# Patient Record
Sex: Male | Born: 1940 | Race: White | Hispanic: No | Marital: Married | Smoking: Former smoker
Health system: Southern US, Community
[De-identification: ages and names within clinical notes are randomized; demographics above are authoritative.]

## PROBLEM LIST (undated history)

## (undated) DIAGNOSIS — I10 Essential (primary) hypertension: Secondary | ICD-10-CM

## (undated) DIAGNOSIS — M1990 Unspecified osteoarthritis, unspecified site: Secondary | ICD-10-CM

## (undated) DIAGNOSIS — C61 Malignant neoplasm of prostate: Secondary | ICD-10-CM

## (undated) DIAGNOSIS — E785 Hyperlipidemia, unspecified: Secondary | ICD-10-CM

## (undated) DIAGNOSIS — K589 Irritable bowel syndrome without diarrhea: Secondary | ICD-10-CM

## (undated) DIAGNOSIS — E119 Type 2 diabetes mellitus without complications: Secondary | ICD-10-CM

## (undated) DIAGNOSIS — I2510 Atherosclerotic heart disease of native coronary artery without angina pectoris: Secondary | ICD-10-CM

## (undated) DIAGNOSIS — K5730 Diverticulosis of large intestine without perforation or abscess without bleeding: Secondary | ICD-10-CM

## (undated) DIAGNOSIS — K219 Gastro-esophageal reflux disease without esophagitis: Secondary | ICD-10-CM

## (undated) DIAGNOSIS — H9190 Unspecified hearing loss, unspecified ear: Secondary | ICD-10-CM

## (undated) HISTORY — DX: Unspecified osteoarthritis, unspecified site: M19.90

## (undated) HISTORY — DX: Gastro-esophageal reflux disease without esophagitis: K21.9

## (undated) HISTORY — DX: Unspecified hearing loss, unspecified ear: H91.90

## (undated) HISTORY — DX: Irritable bowel syndrome without diarrhea: K58.9

## (undated) HISTORY — DX: Malignant neoplasm of prostate: C61

## (undated) HISTORY — DX: Hyperlipidemia, unspecified: E78.5

## (undated) HISTORY — DX: Atherosclerotic heart disease of native coronary artery without angina pectoris: I25.10

## (undated) HISTORY — DX: Diverticulosis of large intestine without perforation or abscess without bleeding: K57.30

## (undated) HISTORY — DX: Essential (primary) hypertension: I10

## (undated) HISTORY — DX: Type 2 diabetes mellitus without complications: E11.9

---

## 1996-01-26 HISTORY — PX: CHOLECYSTECTOMY: SHX55

## 2002-01-25 HISTORY — PX: HEMORRHOID SURGERY: SHX153

## 2004-03-09 ENCOUNTER — Ambulatory Visit (HOSPITAL_COMMUNITY): Admission: RE | Admit: 2004-03-09 | Discharge: 2004-03-09 | Payer: Self-pay | Admitting: Internal Medicine

## 2004-03-09 ENCOUNTER — Ambulatory Visit: Payer: Self-pay | Admitting: Internal Medicine

## 2005-04-07 ENCOUNTER — Ambulatory Visit: Payer: Self-pay | Admitting: Cardiology

## 2005-04-14 ENCOUNTER — Inpatient Hospital Stay (HOSPITAL_BASED_OUTPATIENT_CLINIC_OR_DEPARTMENT_OTHER): Admission: RE | Admit: 2005-04-14 | Discharge: 2005-04-14 | Payer: Self-pay | Admitting: Internal Medicine

## 2005-04-14 ENCOUNTER — Ambulatory Visit: Payer: Self-pay | Admitting: Internal Medicine

## 2005-05-11 ENCOUNTER — Ambulatory Visit: Payer: Self-pay | Admitting: Cardiology

## 2007-01-26 HISTORY — PX: PROSTATECTOMY: SHX69

## 2007-10-24 HISTORY — PX: TOTAL KNEE ARTHROPLASTY: SHX125

## 2014-10-09 ENCOUNTER — Encounter: Payer: Self-pay | Admitting: *Deleted

## 2014-10-09 ENCOUNTER — Other Ambulatory Visit: Payer: Self-pay | Admitting: *Deleted

## 2014-10-15 ENCOUNTER — Ambulatory Visit (INDEPENDENT_AMBULATORY_CARE_PROVIDER_SITE_OTHER): Payer: Medicare Other | Admitting: Cardiology

## 2014-10-15 ENCOUNTER — Encounter: Payer: Self-pay | Admitting: Cardiology

## 2014-10-15 ENCOUNTER — Encounter: Payer: Self-pay | Admitting: *Deleted

## 2014-10-15 VITALS — BP 125/75 | HR 54 | Ht 71.0 in | Wt 241.8 lb

## 2014-10-15 DIAGNOSIS — R0789 Other chest pain: Secondary | ICD-10-CM

## 2014-10-15 DIAGNOSIS — I10 Essential (primary) hypertension: Secondary | ICD-10-CM | POA: Diagnosis not present

## 2014-10-15 DIAGNOSIS — R0602 Shortness of breath: Secondary | ICD-10-CM

## 2014-10-15 DIAGNOSIS — E119 Type 2 diabetes mellitus without complications: Secondary | ICD-10-CM

## 2014-10-15 DIAGNOSIS — Z136 Encounter for screening for cardiovascular disorders: Secondary | ICD-10-CM | POA: Diagnosis not present

## 2014-10-15 DIAGNOSIS — E782 Mixed hyperlipidemia: Secondary | ICD-10-CM

## 2014-10-15 NOTE — Progress Notes (Signed)
Cardiology Office Note  Date: 10/15/2014   ID: Nathan Wiley, DOB [birth year], MRN [id number]  PCP: Olena Mater, MD  Consulting Cardiologist: Rozann Lesches, MD   Chief Complaint  Patient presents with  . History of CAD  . Dyspnea on exertion    History of Present Illness: Nathan Wiley is a 74 y.o. male referred for cardiology consultation by Dr. [name]. He was seen years ago by Dr. Dannielle Burn in our practice, I do not have complete records regarding his history of coronary atherosclerosis. He recalls undergoing a cardiac catheterization at [facility] about 10 years ago, and had what sounds like nonobstructive disease that did not require any specific revascularization.  He presents to the office now describing progressive dyspnea on exertion and fatigue over the last several months, particularly during the summer. He also has been experiencing chest "tightness" with exertion including radiation up into the neck, very consistent with angina. 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  He also states that he is in the process of pursuing elective right knee replacement with an orthopedists in [place]. He already had his left knee replaced about 5 years ago.  ECG today shows sinus rhythm with low voltage and nonspecific ST-T wave abnormalities. Cannot exclude old inferior infarct pattern.  He has not had any interval ischemic evaluation in the last 10 years.   Past Medical History  Diagnosis Date  . Arthritis   . CAD (coronary artery disease)     Nonobstructive at cardiac catheterization approximately 10 years ago  . Type 2 diabetes mellitus   . Dyslipidemia   . GERD (gastroesophageal reflux disease)   . Essential hypertension   . Prostate cancer   . Hearing loss   . Diverticulosis of colon   . Irritable bowel syndrome     Past  Surgical History  Procedure Laterality Date  . Hemorrhoid surgery  2004  . Cholecystectomy  1998  . Total knee arthroplasty Left 10/24/07  . Prostatectomy  2009    Current Outpatient Prescriptions  Medication Sig Dispense Refill  . aspirin EC 81 MG tablet Take 3 tablets by mouth daily.    Marland Kitchen estazolam (PROSOM) 1 MG tablet Take 1 tablet by mouth at bedtime.  0  . metoprolol (LOPRESSOR) 50 MG tablet Take 0.5 tablets by mouth daily.    . metroNIDAZOLE (METROGEL) 1 % gel Apply 1 application topically daily.    Marland Kitchen omeprazole (PRILOSEC) 40 MG capsule Take 1 capsule by mouth daily.    Marland Kitchen tretinoin (RETIN-A) 0.01 % gel Apply 1 application topically at bedtime.     No current facility-administered medications for this visit.    Allergies:  Review of patient's allergies indicates no known allergies.   Social History: The patient  reports that he quit smoking about 33 years ago. His smoking use included Cigarettes. He started smoking about 53 years ago. He has a 20 pack-year smoking history. He has never used smokeless tobacco. He reports that he does not drink alcohol or use illicit drugs.   Family History: The patient's family history includes Arthritis in his sister; Colon cancer in his father and maternal grandfather; Heart failure in his father; Prostate cancer in his father.   ROS:  Please see the history of present illness. Otherwise, complete review of systems is positive for chronic right knee pain.  All  other systems are reviewed and negative.   Physical Exam: VS:  BP 125/75 mmHg  Pulse 54  Ht 5\' 11"  (1.803 m)  Wt 241 lb 12.8 oz (109.68 kg)  BMI 33.74 kg/m2  SpO2 99%, BMI Body mass index is 33.74 kg/(m^2).  Wt Readings from Last 3 Encounters:  10/15/14 241 lb 12.8 oz (109.68 kg)     General: Patient appears comfortable at rest. HEENT: Conjunctiva and lids normal, oropharynx clear. Neck: Supple, no elevated JVP or carotid bruits, no thyromegaly. Lungs: Clear to auscultation,  nonlabored breathing at rest. Cardiac: Regular rate and rhythm, no S3 or significant systolic murmur, no pericardial rub. Abdomen: Soft, nontender, bowel sounds present. Extremities: No pitting edema, distal pulses 2+.i Skin: Warm and dry. Musculoskeletal: No kyphosis. Neuropsychiatric: Alert and oriented x3, affect grossly appropriate.   ECG: ECG is ordered today.   Recent Labwork:  09/23/2014: Cholesterol 197, triglycerides 240, HDL 29, LDL 120, AST 18, ALT 16, potassium 4.8, BUN 15, creatinine 1.0, hemoglobin 15.6, platelets 227  Assessment and Plan:  1. Dyspnea on exertion and chest tightness consistent with angina. Patient has a history of nonobstructive CAD by cardiac catheterization approximately 10 years ago. ECG is abnormal but largely nonspecific as outlined above. He has concurrent type 2 diabetes mellitus, hyperlipidemia and hypertension. Plan is to proceed with a Lexiscan Cardiolite for further evaluation. I have asked that he put his right knee replacement surgery on hold for now pending further cardiac evaluation.  2. Essential hypertension, blood pressure normal today. He is on Lopressor.  3. Hyperlipidemia by history, recent LDL 120. Not on any specific medical therapy at this time.  4. Type 2 diabetes mellitus, followed by Dr. [name].  Current medicines were reviewed with the patient today.   Orders Placed This Encounter  Procedures  . NM Myocar Multi W/Spect W/Wall Motion / EF  . Myocardial Perfusion Imaging  . EKG 12-Lead    Disposition: Call with test results.   Signed, Satira Sark, MD, [facility] 10/15/2014 3:29 PM    [facility] [address], [place], [place] [address] Phone: [phone]; Fax: [phone]

## 2014-10-15 NOTE — Patient Instructions (Signed)
Your physician recommends that you continue on your current medications as directed. Please refer to the Current Medication list given to you today. Your physician has requested that you have a lexiscan myoview. For further information please visit [email or web address]. Please follow instruction sheet, as given. We will call you with your results. 

## 2014-10-21 ENCOUNTER — Encounter (HOSPITAL_COMMUNITY)
Admission: RE | Admit: 2014-10-21 | Discharge: 2014-10-21 | Disposition: A | Discharge status: Home / Self Care | Payer: Medicare Other | Source: Ambulatory Visit | Attending: Cardiology | Admitting: Cardiology

## 2014-10-21 ENCOUNTER — Inpatient Hospital Stay (HOSPITAL_COMMUNITY): Admission: RE | Admit: 2014-10-21 | Payer: Private Health Insurance - Indemnity | Source: Ambulatory Visit

## 2014-10-21 ENCOUNTER — Encounter (HOSPITAL_COMMUNITY): Payer: Self-pay

## 2014-10-21 DIAGNOSIS — R0602 Shortness of breath: Secondary | ICD-10-CM | POA: Diagnosis not present

## 2014-10-21 DIAGNOSIS — R0789 Other chest pain: Secondary | ICD-10-CM

## 2014-10-21 LAB — NM MYOCAR MULTI W/SPECT W/WALL MOTION / EF
CHL CUP NUCLEAR SDS: 3
CHL CUP NUCLEAR SRS: 3
CHL CUP RESTING HR STRESS: 52 {beats}/min
LV dias vol: 84 mL
LV sys vol: 37 mL
NUC STRESS TID: 1.21
Peak HR: 76 {beats}/min
RATE: 0.35
SSS: 6

## 2014-10-21 MED ORDER — TECHNETIUM TC 99M SESTAMIBI GENERIC - CARDIOLITE
10.0000 | Freq: Once | INTRAVENOUS | Status: AC | PRN
  Administered 2014-10-21: 11 via INTRAVENOUS

## 2014-10-21 MED ORDER — REGADENOSON 0.4 MG/5ML IV SOLN
INTRAVENOUS | Status: AC
  Administered 2014-10-21: 0.4 mg via INTRAVENOUS
  Filled 2014-10-21: qty 5

## 2014-10-21 MED ORDER — TECHNETIUM TC 99M SESTAMIBI - CARDIOLITE
30.0000 | Freq: Once | INTRAVENOUS | Status: AC | PRN
  Administered 2014-10-21: 10:00:00 32 via INTRAVENOUS

## 2014-10-21 MED ORDER — SODIUM CHLORIDE 0.9 % IJ SOLN
INTRAMUSCULAR | Status: AC
  Administered 2014-10-21: 10 mL via INTRAVENOUS
  Filled 2014-10-21: qty 3

## 2014-10-22 ENCOUNTER — Telehealth: Payer: Self-pay | Admitting: *Deleted

## 2014-10-22 NOTE — Telephone Encounter (Signed)
Patient informed. 

## 2014-10-22 NOTE — Telephone Encounter (Signed)
-----   Message from Satira Sark, MD sent at 10/22/2014 12:27 PM EDT ----- Reviewed report and images. No focal ischemia with normal LVEF, overall low risk examination. This would argue against substantial progression in previously documented nonobstructive CAD, although his recent symptoms were consistent with angina. We will continue medical therapy and arrange office follow-up (3-4 months). Can always discuss further evaluation with cardiac catheterization if his symptoms progress further. Based on this stress test evaluation, he should be able to proceed with planned knee surgery as discussed in the recent consultation note.

## 2014-10-24 ENCOUNTER — Telehealth: Payer: Self-pay | Admitting: *Deleted

## 2014-10-24 MED ORDER — ISOSORBIDE MONONITRATE ER 30 MG PO TB24: 30.0000 mg | ORAL_TABLET | Freq: Every day | ORAL | Status: AC

## 2014-10-24 NOTE — Telephone Encounter (Signed)
I am concerned about his recurrent symptoms, although recent stress test was low risk and did not demonstrate any active ischemia. We could try putting him on Imdur 30 mg daily to see if this is helpful. Frankly, if he continues to have symptoms on medical therapy, he may need to have a repeat cardiac catheterization.

## 2014-10-24 NOTE — Telephone Encounter (Signed)
Patient informed and also discussed the possibility of having a headache with this medications. Patient advised that if a headache occurs, try taking tylenol for his headache and that it should go away after a few days. Patient informed that if the headache did not go away, he should try half the tablet daily and if the headache continued that he would need to contact our office so that he can stop the medication.

## 2014-10-24 NOTE — Telephone Encounter (Signed)
Patient c/o having chest pain last night that radiated to throat and both sides of jaw last night that prevented him from swallowing and lasted 10-15 seconds. Patient rated chest pain 8/10. Patient said he had to take short breaths because he couldn't swallow. Patient is unsure if the pain made him feel dizzy. Patient said that the pain was worse than the times he experienced it before. Patient does not have nitroglycerin at home. Please advise.

## 2015-01-22 ENCOUNTER — Ambulatory Visit: Payer: Private Health Insurance - Indemnity | Admitting: Cardiology

## 2015-03-04 DIAGNOSIS — C4492 Squamous cell carcinoma of skin, unspecified: Secondary | ICD-10-CM

## 2015-03-04 HISTORY — DX: Squamous cell carcinoma of skin, unspecified: C44.92

## 2017-06-10 ENCOUNTER — Institutional Professional Consult (permissible substitution): Payer: Private Health Insurance - Indemnity | Admitting: Internal Medicine

## 2019-05-15 ENCOUNTER — Other Ambulatory Visit: Payer: Self-pay

## 2019-05-15 ENCOUNTER — Encounter: Payer: Self-pay | Admitting: Physician Assistant

## 2019-05-15 ENCOUNTER — Ambulatory Visit: Payer: Self-pay | Admitting: Physician Assistant

## 2019-05-15 ENCOUNTER — Ambulatory Visit (INDEPENDENT_AMBULATORY_CARE_PROVIDER_SITE_OTHER): Payer: Medicare HMO | Admitting: Physician Assistant

## 2019-05-15 ENCOUNTER — Encounter: Payer: Self-pay | Admitting: *Deleted

## 2019-05-15 DIAGNOSIS — D492 Neoplasm of unspecified behavior of bone, soft tissue, and skin: Secondary | ICD-10-CM

## 2019-05-15 DIAGNOSIS — D485 Neoplasm of uncertain behavior of skin: Secondary | ICD-10-CM | POA: Diagnosis not present

## 2019-05-15 DIAGNOSIS — D489 Neoplasm of uncertain behavior, unspecified: Secondary | ICD-10-CM

## 2019-05-15 NOTE — Patient Instructions (Signed)

## 2019-05-16 ENCOUNTER — Encounter: Payer: Self-pay | Admitting: Physician Assistant

## 2019-05-16 NOTE — Progress Notes (Addendum)
   Follow-Up Visit   Subjective  Nathan Wiley is a 79 y.o. male who presents for the following: Skin Problem (mid chest for years cyst? patient stated it was hard). No drainage, growing, persistent, no bleeding. I bothers him and get irritated from his shirt collar.    The following portions of the chart were reviewed this encounter and updated as appropriate: Tobacco  Allergies  Meds  Problems  Med Hx  Surg Hx  Fam Hx      Objective  Well appearing patient in no apparent distress; mood and affect are within normal limits.  All skin waist up examined.  Objective  Chest - Medial [facility]): White papule     Assessment & Plan  Neoplasm of uncertain behavior Chest - Medial (Center)  Skin / nail biopsy Type of biopsy: punch   Informed consent: discussed and consent obtained   Timeout: patient name, date of birth, surgical site, and procedure verified   Procedure prep:  Patient was prepped and draped in usual sterile fashion (nonsterile) Prep type:  Chlorhexidine Anesthesia: the lesion was anesthetized in a standard fashion   Anesthetic:  1% lidocaine w/ epinephrine 1-100,000 local infiltration Punch size:  8 mm Suture type: nylon   Suture removal (days):  14 Hemostasis achieved with: suture   Outcome: patient tolerated procedure well   Post-procedure details: wound care instructions given   Post-procedure details comment:  Nonsterile  Specimen 1 - Surgical pathology Differential Diagnosis: milia Check Margins: No

## 2019-05-28 ENCOUNTER — Other Ambulatory Visit: Payer: Self-pay

## 2019-05-28 ENCOUNTER — Ambulatory Visit (INDEPENDENT_AMBULATORY_CARE_PROVIDER_SITE_OTHER): Payer: Medicare HMO | Admitting: *Deleted

## 2019-05-28 DIAGNOSIS — Z4802 Encounter for removal of sutures: Secondary | ICD-10-CM

## 2019-05-28 NOTE — Progress Notes (Signed)
Patient here for suture removal. Removed two sutures from chest, Looks good no signs of infection. Pathology report to patient.

## 2019-05-28 NOTE — Patient Instructions (Signed)
After Suture Removal ° °1. After sutures are removed, the wound should be coated with an antibiotic ointment (eg. Polysporin, Bacitracin) and, if possible, kept covered with a Band-Aid or bandage for an additional 24 hours.  After that, no additional wound care is generally needed.  °2. It is alright to get the area wet. °3. If a skin cancer was removed, your skin should be re-examined in approximately three months. ° ° ° °

## 2020-03-25 ENCOUNTER — Emergency Department (HOSPITAL_COMMUNITY)
Admission: EM | Admit: 2020-03-25 | Discharge: 2020-03-26 | Disposition: A | Discharge status: Home / Self Care | Payer: Medicare HMO | Attending: Emergency Medicine | Admitting: Emergency Medicine

## 2020-03-25 ENCOUNTER — Other Ambulatory Visit: Payer: Self-pay

## 2020-03-25 ENCOUNTER — Encounter (HOSPITAL_COMMUNITY): Payer: Self-pay | Admitting: Emergency Medicine

## 2020-03-25 DIAGNOSIS — Z8546 Personal history of malignant neoplasm of prostate: Secondary | ICD-10-CM | POA: Insufficient documentation

## 2020-03-25 DIAGNOSIS — I10 Essential (primary) hypertension: Secondary | ICD-10-CM | POA: Insufficient documentation

## 2020-03-25 DIAGNOSIS — I2510 Atherosclerotic heart disease of native coronary artery without angina pectoris: Secondary | ICD-10-CM | POA: Diagnosis not present

## 2020-03-25 DIAGNOSIS — M7989 Other specified soft tissue disorders: Secondary | ICD-10-CM | POA: Insufficient documentation

## 2020-03-25 DIAGNOSIS — R911 Solitary pulmonary nodule: Secondary | ICD-10-CM

## 2020-03-25 DIAGNOSIS — Z85828 Personal history of other malignant neoplasm of skin: Secondary | ICD-10-CM | POA: Insufficient documentation

## 2020-03-25 DIAGNOSIS — Z96642 Presence of left artificial hip joint: Secondary | ICD-10-CM | POA: Diagnosis not present

## 2020-03-25 DIAGNOSIS — Z79899 Other long term (current) drug therapy: Secondary | ICD-10-CM | POA: Insufficient documentation

## 2020-03-25 DIAGNOSIS — R799 Abnormal finding of blood chemistry, unspecified: Secondary | ICD-10-CM | POA: Insufficient documentation

## 2020-03-25 DIAGNOSIS — E119 Type 2 diabetes mellitus without complications: Secondary | ICD-10-CM | POA: Insufficient documentation

## 2020-03-25 DIAGNOSIS — Z96652 Presence of left artificial knee joint: Secondary | ICD-10-CM | POA: Diagnosis not present

## 2020-03-25 DIAGNOSIS — Z7982 Long term (current) use of aspirin: Secondary | ICD-10-CM | POA: Diagnosis not present

## 2020-03-25 DIAGNOSIS — Z87891 Personal history of nicotine dependence: Secondary | ICD-10-CM | POA: Insufficient documentation

## 2020-03-25 LAB — CBC WITH DIFFERENTIAL/PLATELET
Abs Immature Granulocytes: 0.06 10*3/uL (ref 0.00–0.07)
Basophils Absolute: 0.1 10*3/uL (ref 0.0–0.1)
Basophils Relative: 1 %
Eosinophils Absolute: 0.3 10*3/uL (ref 0.0–0.5)
Eosinophils Relative: 3 %
HCT: 36.4 % — ABNORMAL LOW (ref 39.0–52.0)
Hemoglobin: 11.8 g/dL — ABNORMAL LOW (ref 13.0–17.0)
Immature Granulocytes: 1 %
Lymphocytes Relative: 14 %
Lymphs Abs: 1.6 10*3/uL (ref 0.7–4.0)
MCH: 29.8 pg (ref 26.0–34.0)
MCHC: 32.4 g/dL (ref 30.0–36.0)
MCV: 91.9 fL (ref 80.0–100.0)
Monocytes Absolute: 0.9 10*3/uL (ref 0.1–1.0)
Monocytes Relative: 8 %
Neutro Abs: 8.2 10*3/uL — ABNORMAL HIGH (ref 1.7–7.7)
Neutrophils Relative %: 73 %
Platelets: 277 10*3/uL (ref 150–400)
RBC: 3.96 MIL/uL — ABNORMAL LOW (ref 4.22–5.81)
RDW: 12.5 % (ref 11.5–15.5)
WBC: 11 10*3/uL — ABNORMAL HIGH (ref 4.0–10.5)
nRBC: 0 % (ref 0.0–0.2)

## 2020-03-25 LAB — COMPREHENSIVE METABOLIC PANEL
ALT: 36 U/L (ref 0–44)
AST: 30 U/L (ref 15–41)
Albumin: 3.3 g/dL — ABNORMAL LOW (ref 3.5–5.0)
Alkaline Phosphatase: 65 U/L (ref 38–126)
Anion gap: 9 (ref 5–15)
BUN: 23 mg/dL (ref 8–23)
CO2: 25 mmol/L (ref 22–32)
Calcium: 9 mg/dL (ref 8.9–10.3)
Chloride: 103 mmol/L (ref 98–111)
Creatinine, Ser: 1.47 mg/dL — ABNORMAL HIGH (ref 0.61–1.24)
GFR, Estimated: 49 mL/min — ABNORMAL LOW (ref >60)
Glucose, Bld: 146 mg/dL — ABNORMAL HIGH (ref 70–99)
Potassium: 4.4 mmol/L (ref 3.5–5.1)
Sodium: 137 mmol/L (ref 135–145)
Total Bilirubin: 0.8 mg/dL (ref 0.3–1.2)
Total Protein: 6.3 g/dL — ABNORMAL LOW (ref 6.5–8.1)

## 2020-03-25 LAB — D-DIMER, QUANTITATIVE: D-Dimer, Quant: 3.99 ug/mL-FEU — ABNORMAL HIGH (ref 0.00–0.50)

## 2020-03-25 NOTE — ED Triage Notes (Signed)
Patient here with abnormal labs from PCP.  Patient states that the DDimer was elevated and he possibly has a DVT in left leg.  Patient states that he has swelling in that leg.  Patient has a mild amount of pain at this time.

## 2020-03-26 ENCOUNTER — Emergency Department (HOSPITAL_BASED_OUTPATIENT_CLINIC_OR_DEPARTMENT_OTHER): Payer: Medicare HMO

## 2020-03-26 ENCOUNTER — Emergency Department (HOSPITAL_COMMUNITY): Payer: Medicare HMO

## 2020-03-26 DIAGNOSIS — Z9889 Other specified postprocedural states: Secondary | ICD-10-CM | POA: Diagnosis not present

## 2020-03-26 DIAGNOSIS — M79662 Pain in left lower leg: Secondary | ICD-10-CM | POA: Diagnosis not present

## 2020-03-26 DIAGNOSIS — R609 Edema, unspecified: Secondary | ICD-10-CM | POA: Diagnosis not present

## 2020-03-26 DIAGNOSIS — M7989 Other specified soft tissue disorders: Secondary | ICD-10-CM

## 2020-03-26 MED ORDER — FENTANYL CITRATE (PF) 100 MCG/2ML IJ SOLN
50.0000 ug | Freq: Once | INTRAMUSCULAR | Status: DC
  Filled 2020-03-26: qty 2

## 2020-03-26 MED ORDER — OXYCODONE-ACETAMINOPHEN 5-325 MG PO TABS
2.0000 | ORAL_TABLET | Freq: Once | ORAL | Status: AC
Start: 2020-03-26 — End: 2020-03-26
  Administered 2020-03-26: 2 via ORAL
  Filled 2020-03-26: qty 2

## 2020-03-26 MED ORDER — OXYCODONE-ACETAMINOPHEN 5-325 MG PO TABS
1.0000 | ORAL_TABLET | Freq: Once | ORAL | Status: AC
  Administered 2020-03-26: 1 via ORAL
  Filled 2020-03-26: qty 1

## 2020-03-26 MED ORDER — IOHEXOL 350 MG/ML SOLN
100.0000 mL | Freq: Once | INTRAVENOUS | Status: AC | PRN
  Administered 2020-03-26: 100 mL via INTRAVENOUS

## 2020-03-26 NOTE — Progress Notes (Signed)
Lower extremity venous has been completed.   Preliminary results in CV Proc.   Abram Sander 03/26/2020 9:02 AM

## 2020-03-26 NOTE — Discharge Instructions (Addendum)
There was a nodule seen in your right lung.  You will need to follow-up in 6 to 12 months for repeat imaging. Please inform your primary care provider about this. Your CT scan did not show any evidence of a blood clot in your lung. Your ultrasound did not show any evidence of a blood clot in your like either. This may be related to your surgery. Follow-up with your primary care provider and orthopedic surgeon. Return to the ER if you start to experience worsening pain, swelling, fever, injury or fall

## 2020-03-26 NOTE — ED Provider Notes (Signed)
Physical Exam  BP 140/81   Pulse 66   Temp 99 F (37.2 C) (Oral)   Resp 15   SpO2 100%   Physical Exam Vitals and nursing note reviewed.  Constitutional:      General: He is not in acute distress.    Appearance: He is well-developed and well-nourished. He is not diaphoretic.  HENT:     Head: Normocephalic and atraumatic.  Eyes:     General: No scleral icterus.    Extraocular Movements: EOM normal.     Conjunctiva/sclera: Conjunctivae normal.  Pulmonary:     Effort: Pulmonary effort is normal. No respiratory distress.  Musculoskeletal:     Cervical back: Normal range of motion.  Skin:    Findings: No rash.  Neurological:     Mental Status: He is alert.  Psychiatric:        Mood and Affect: Mood and affect normal.     ED Course/Procedures   Clinical Course as of 03/26/20 0939  Wed Mar 26, 2020  0728 VAS [place] LOWER EXTREMITY VENOUS (DVT) (ONLY [facility] & [facility]) No evidence of DVT. [HK]    Clinical Course User Index [HK] Delia Heady, PA-C    Procedures  MDM   Care of patient assumed from [place] at 7 AM.  Agree with history, physical exam and plan.  See their note for further details.  Briefly, 80 y.o. male with PMH/PSH as below who presents with 4-day history of left calf swelling and tightness.  He is status post left total hip replacement 8 days ago.  Denies any chest pain, shortness of breath, history of DVT or PE. Patient was sent here from PCPs office for elevated D-dimer in the setting of recent surgery. He is currently on full dose of aspirin but no other anticoagulation. CTA without any evidence of PE.  There is an incidental pulmonary nodule noted. CMP and CBC unremarkable.  Past Medical History:  Diagnosis Date  . Arthritis   . CAD (coronary artery disease)    Nonobstructive at cardiac catheterization approximately 10 years ago  . Diverticulosis of colon   . Dyslipidemia   . Essential hypertension   . GERD (gastroesophageal reflux disease)   . Hearing  loss   . Irritable bowel syndrome   . Prostate cancer ([place])   . 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 Type 2 diabetes mellitus ([place])    Past Surgical History:  Procedure Laterality Date  . CHOLECYSTECTOMY  1998  . HEMORRHOID SURGERY  2004  . PROSTATECTOMY  2009  . TOTAL KNEE ARTHROPLASTY Left 10/24/07      Current Plan: Obtain left lower extremity ultrasound to rule out DVT. Anticipate discharge home +/- oral anticoagulation.   MDM/ED Course: 9:05 AM Patient without any evidence of DVT on left lower extremity ultrasound. Suspect this could be related to recent surgery.  There is no evidence of fluid on CT of chest. Will have him follow up with his surgeon. Patient is agreeable to the plan.  Return precautions given.   Consults: None   Significant labs/images: CT Angio Chest PE W and/or Wo Contrast  Result Date: 03/26/2020 CLINICAL DATA:  PE suspected. Recent hip replacement. Likely left lower extremity DVT. EXAM: CT ANGIOGRAPHY CHEST WITH CONTRAST TECHNIQUE: Multidetector CT imaging of the chest was performed using the standard protocol during bolus administration of intravenous contrast. Multiplanar CT image reconstructions and MIPs were obtained to evaluate the vascular anatomy. CONTRAST:  145mL OMNIPAQUE  IOHEXOL 350 MG/ML SOLN COMPARISON:  None. FINDINGS: Cardiovascular: Contrast injection is sufficient to demonstrate satisfactory opacification of the pulmonary arteries to the segmental level. There is no pulmonary embolus or evidence of right heart strain. The size of the main pulmonary artery is normal. Cardiomegaly with coronary artery calcification. The course and caliber of the aorta are normal. There is mild atherosclerotic calcification. Opacification decreased due to pulmonary arterial phase contrast bolus timing. Mediastinum/Nodes: -- No mediastinal lymphadenopathy. -- No hilar lymphadenopathy. -- No axillary lymphadenopathy. -- No  supraclavicular lymphadenopathy. -- Normal thyroid gland where visualized. -  Unremarkable esophagus. Lungs/Pleura: There is a ground-glass airspace opacity in the right upper lobe (axial series 6, image 49), measuring approximately 8 mm. There is no pneumothorax. No large pleural effusion. Upper Abdomen: Contrast bolus timing is not optimized for evaluation of the abdominal organs. The patient is status post prior cholecystectomy. There is no acute abnormality in the upper abdomen. The partially visualized spleen appears to be enlarged. Musculoskeletal: No chest wall abnormality. No bony spinal canal stenosis. Review of the MIP images confirms the above findings. IMPRESSION: 1. No evidence of pulmonary embolus. 2. Ground-glass airspace opacity in the right upper lobe. Initial follow-up with CT at 6-12 months is recommended to confirm persistence. If persistent, repeat CT is recommended every 2 years until 5 years of stability has been established. This recommendation follows the consensus statement: Guidelines for Management of Incidental Pulmonary Nodules Detected on CT Images: From the Fleischner Society 2017; Radiology 2017; 284:228-243. 3. Cardiomegaly with coronary artery disease. 4. Probable splenomegaly. Aortic Atherosclerosis (ICD10-I70.0). Electronically Signed   By: Constance Holster M.D.   On: 03/26/2020 03:38   VAS [place] LOWER EXTREMITY VENOUS (DVT) (ONLY [facility] & [facility])  Result Date: 03/26/2020  Lower Venous DVT Study Indications: Edema, and Swelling.  Comparison Study: no prior Performing Technologist: Abram Sander RVS  Examination Guidelines: A complete evaluation includes B-mode imaging, spectral Doppler, color Doppler, and power Doppler as needed of all accessible portions of each vessel. Bilateral testing is considered an integral part of a complete examination. Limited examinations for reoccurring indications may be performed as noted. The reflux portion of the exam is performed with the patient in  reverse Trendelenburg.  +-----+---------------+---------+-----------+----------+--------------+ RIGHTCompressibilityPhasicitySpontaneityPropertiesThrombus Aging +-----+---------------+---------+-----------+----------+--------------+ CFV  Full           Yes      Yes                                 +-----+---------------+---------+-----------+----------+--------------+   +---------+---------------+---------+-----------+----------+--------------+ LEFT     CompressibilityPhasicitySpontaneityPropertiesThrombus Aging +---------+---------------+---------+-----------+----------+--------------+ CFV      Full           Yes      Yes                                 +---------+---------------+---------+-----------+----------+--------------+ SFJ      Full                                                        +---------+---------------+---------+-----------+----------+--------------+ FV Prox  Full                                                        +---------+---------------+---------+-----------+----------+--------------+  FV Mid   Full                                                        +---------+---------------+---------+-----------+----------+--------------+ FV DistalFull                                                        +---------+---------------+---------+-----------+----------+--------------+ PFV      Full                                                        +---------+---------------+---------+-----------+----------+--------------+ POP      Full           Yes      Yes                                 +---------+---------------+---------+-----------+----------+--------------+ PTV      Full                                                        +---------+---------------+---------+-----------+----------+--------------+ PERO     Full                                                         +---------+---------------+---------+-----------+----------+--------------+     Summary: RIGHT: - No evidence of common femoral vein obstruction.  LEFT: - There is no evidence of deep vein thrombosis in the lower extremity.  - No cystic structure found in the popliteal fossa.  *See table(s) above for measurements and observations.    Preliminary     I personally reviewed and interpreted all labs.   Patient is hemodynamically stable, in NAD. Evaluation does not show pathology that would require ongoing emergent intervention or inpatient treatment. I explained the diagnosis to the patient. Pain has been managed and has no complaints prior to discharge. Patient is comfortable with above plan and is stable for discharge at this time. All questions were answered prior to disposition. Strict return precautions for returning to the ED were discussed. Encouraged follow up with PCP.   An After Visit Summary was printed and given to the patient.   Portions of this note were generated with [employer]. Dictation errors may occur despite best attempts at proofreading.      Delia Heady, PA-C 03/26/20 0940    Tegeler, Gwenyth Allegra, MD 03/26/20 [phone]

## 2020-03-26 NOTE — ED Provider Notes (Signed)
[name] Provider Note   CSN: [id number] Arrival date & time: 03/25/20  1916     History Chief Complaint  Patient presents with  . Abnormal Lab    Nathan Wiley is a 80 y.o. male.  The history is provided by the patient and medical records.   80 year old male with history of arthritis, coronary artery disease, dyslipidemia, hypertension, GERD, history of prostate cancer, diabetes, presenting to the ED with abnormal labs from PCP office.  Patient had left total hip replacement about 8 days ago, states procedure went well and his hip has been healing normally.  States about 4 days ago he developed some swelling of his left calf, has gotten somewhat larger since began.  He obviously has pain in the left leg from surgery but states calf feels "tight" when walking.  He has not had any new falls or trauma.  He is not on any anticoagulation postprocedure, states he has been taking full dose aspirin daily instead.  He has not noticed any chest pain or increased shortness of breath.  PCP did labs today which showed an elevated D-dimer and was sent here for further evaluation.  He has no known history of DVT or PE in the past.  Past Medical History:  Diagnosis Date  . Arthritis   . CAD (coronary artery disease)    Nonobstructive at cardiac catheterization approximately 10 years ago  . Diverticulosis of colon   . Dyslipidemia   . Essential hypertension   . GERD (gastroesophageal reflux disease)   . Hearing loss   . Irritable bowel syndrome   . Prostate cancer ([place])   . 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 Type 2 diabetes mellitus (HCC)     There are no problems to display for this patient.   Past Surgical History:  Procedure Laterality Date  . CHOLECYSTECTOMY  1998  . HEMORRHOID SURGERY  2004  . PROSTATECTOMY  2009  . TOTAL KNEE ARTHROPLASTY Left 10/24/07       Family History  Problem Relation Age  of Onset  . Prostate cancer Father   . Heart failure Father   . Colon cancer Father   . Arthritis Sister   . Colon cancer Maternal Grandfather     Social History   Tobacco Use  . Smoking status: Former Smoker    Packs/day: 1.00    Years: 20.00    Pack years: 20.00    Types: Cigarettes    Start date: 09/25/1961    Quit date: 09/25/1981    Years since quitting: 38.5  . Smokeless tobacco: Never Used  Substance Use Topics  . Alcohol use: No    Alcohol/week: 0.0 standard drinks  . Drug use: No    Home Medications Prior to Admission medications   Medication Sig Start Date End Date Taking? Authorizing Provider  aspirin EC 81 MG tablet Take 3 tablets by mouth daily.    [provider]  estazolam (PROSOM) 1 MG tablet Take 1 tablet by mouth at bedtime. 08/09/14   [provider]  isosorbide mononitrate (IMDUR) 30 MG 24 hr tablet Take 1 tablet (30 mg total) by mouth daily. 10/24/14   Satira Sark, MD  metoprolol (LOPRESSOR) 50 MG tablet Take 0.5 tablets by mouth daily. 11/13/13   [provider]  metroNIDAZOLE (METROGEL) 1 % gel Apply 1 application topically daily. 07/21/12   [provider]  omeprazole (PRILOSEC) 40 MG capsule Take  1 capsule by mouth daily. 06/06/14   [provider]  tretinoin (RETIN-A) 0.01 % gel Apply 1 application topically at bedtime. 07/21/12   [provider]    Allergies    Patient has no known allergies.  Review of Systems   Review of Systems  Cardiovascular: Positive for leg swelling.  All other systems reviewed and are negative.   Physical Exam Updated Vital Signs BP (!) 127/57 (BP Location: Left Arm)   Pulse 70   Temp 99 F (37.2 C) (Oral)   Resp 17   SpO2 99%   Physical Exam Vitals and nursing note reviewed.  Constitutional:      Appearance: He is well-developed and well-nourished.  HENT:     Head: Normocephalic and atraumatic.     Mouth/Throat:     Mouth: Oropharynx is clear and  moist.  Eyes:     Extraocular Movements: EOM normal.     Conjunctiva/sclera: Conjunctivae normal.     Pupils: Pupils are equal, round, and reactive to light.  Cardiovascular:     Rate and Rhythm: Normal rate and regular rhythm.     Heart sounds: Normal heart sounds.  Pulmonary:     Effort: Pulmonary effort is normal. No respiratory distress.     Breath sounds: No rhonchi.  Abdominal:     General: Bowel sounds are normal.     Palpations: Abdomen is soft.     Tenderness: There is no guarding.     Hernia: No hernia is present.  Musculoskeletal:        General: Normal range of motion.     Cervical back: Normal range of motion.     Comments: Incision along left lateral hip overall appears clean, some mild bruising in various stages of healing, no wound dehiscence, bleeding, surrounding erythema, or drainage noted Calf asymmetry noted (left > right), some warmth to touch without overlying erythema/indruation, DP pulses intact BLE, no palpable cord noted  Skin:    General: Skin is warm and dry.  Neurological:     Mental Status: He is alert and oriented to person, place, and time.  Psychiatric:        Mood and Affect: Mood and affect normal.     ED Results / Procedures / Treatments   Labs (all labs ordered are listed, but only abnormal results are displayed) Labs Reviewed  CBC WITH DIFFERENTIAL/PLATELET - Abnormal; Notable for the following components:      Result Value   WBC 11.0 (*)    RBC 3.96 (*)    Hemoglobin 11.8 (*)    HCT 36.4 (*)    Neutro Abs 8.2 (*)    All other components within normal limits  COMPREHENSIVE METABOLIC PANEL - Abnormal; Notable for the following components:   Glucose, Bld 146 (*)    Creatinine, Ser 1.47 (*)    Total Protein 6.3 (*)    Albumin 3.3 (*)    GFR, Estimated 49 (*)    All other components within normal limits  D-DIMER, QUANTITATIVE - Abnormal; Notable for the following components:   D-Dimer, Quant 3.99 (*)    All other components within  normal limits    EKG None  Radiology CT Angio Chest PE W and/or Wo Contrast  Result Date: 03/26/2020 CLINICAL DATA:  PE suspected. Recent hip replacement. Likely left lower extremity DVT. EXAM: CT ANGIOGRAPHY CHEST WITH CONTRAST TECHNIQUE: Multidetector CT imaging of the chest was performed using the standard protocol during bolus administration of intravenous contrast. Multiplanar CT image  reconstructions and MIPs were obtained to evaluate the vascular anatomy. CONTRAST:  148mL OMNIPAQUE IOHEXOL 350 MG/ML SOLN COMPARISON:  None. FINDINGS: Cardiovascular: Contrast injection is sufficient to demonstrate satisfactory opacification of the pulmonary arteries to the segmental level. There is no pulmonary embolus or evidence of right heart strain. The size of the main pulmonary artery is normal. Cardiomegaly with coronary artery calcification. The course and caliber of the aorta are normal. There is mild atherosclerotic calcification. Opacification decreased due to pulmonary arterial phase contrast bolus timing. Mediastinum/Nodes: -- No mediastinal lymphadenopathy. -- No hilar lymphadenopathy. -- No axillary lymphadenopathy. -- No supraclavicular lymphadenopathy. -- Normal thyroid gland where visualized. -  Unremarkable esophagus. Lungs/Pleura: There is a ground-glass airspace opacity in the right upper lobe (axial series 6, image 49), measuring approximately 8 mm. There is no pneumothorax. No large pleural effusion. Upper Abdomen: Contrast bolus timing is not optimized for evaluation of the abdominal organs. The patient is status post prior cholecystectomy. There is no acute abnormality in the upper abdomen. The partially visualized spleen appears to be enlarged. Musculoskeletal: No chest wall abnormality. No bony spinal canal stenosis. Review of the MIP images confirms the above findings. IMPRESSION: 1. No evidence of pulmonary embolus. 2. Ground-glass airspace opacity in the right upper lobe. Initial follow-up  with CT at 6-12 months is recommended to confirm persistence. If persistent, repeat CT is recommended every 2 years until 5 years of stability has been established. This recommendation follows the consensus statement: Guidelines for Management of Incidental Pulmonary Nodules Detected on CT Images: From the Fleischner Society 2017; Radiology 2017; 284:228-243. 3. Cardiomegaly with coronary artery disease. 4. Probable splenomegaly. Aortic Atherosclerosis (ICD10-I70.0). Electronically Signed   By: Constance Holster M.D.   On: 03/26/2020 03:38    Procedures Procedures   Medications Ordered in ED Medications  oxyCODONE-acetaminophen (PERCOCET/ROXICET) 5-325 MG per tablet 2 tablet (2 tablets Oral Given 03/26/20 0233)  iohexol (OMNIPAQUE) 350 MG/ML injection 100 mL (100 mLs Intravenous Contrast Given 03/26/20 0237)    ED Course  I have reviewed the triage vital signs and the nursing notes.  Pertinent labs & imaging results that were available during my care of the patient were reviewed by me and considered in my medical decision making (see chart for details).    MDM Rules/Calculators/A&P  80 y.o. M here with left leg swelling.  He is s/p left total hip replacement 8 days ago.  No hx of DVT or PE.  Currently on full dose ASA but no formal anticoagulation.  He is afebrile, non-toxic.  Does have noted calf asymmetry on exam, left > right.  No overlying erythema, induration, or signs of cellulitis.  No tissue crepitus.  DP pulses intact BLE.  Labs as above-- does have elevated d-dimer.  Unfortunately vascular US not available at this hour.  Will get CTA of chest to ensure no PE.  Patient given his usual percocet for post-op pain of the hip.  4:04 AM CTA negative for PE.  Patient comfortable at present.  As vascular will arrive here shortly, will have him wait here to get venous duplex this AM rather than going home to come back in just a few hours.  Care will be signed out to oncoming provider to  follow-up on vascular US.  Final Clinical Impression(s) / ED Diagnoses Final diagnoses:  Left leg swelling    Rx / DC Orders ED Discharge Orders    None       Larene Pickett, PA-C 03/26/20 [phone]  Ripley Fraise, MD 03/26/20 0730

## 2020-09-02 ENCOUNTER — Other Ambulatory Visit: Payer: Self-pay

## 2020-09-02 ENCOUNTER — Ambulatory Visit (INDEPENDENT_AMBULATORY_CARE_PROVIDER_SITE_OTHER): Payer: Medicare HMO | Admitting: Physician Assistant

## 2020-09-02 ENCOUNTER — Encounter: Payer: Self-pay | Admitting: Physician Assistant

## 2020-09-02 DIAGNOSIS — L719 Rosacea, unspecified: Secondary | ICD-10-CM

## 2020-09-02 MED ORDER — ALCLOMETASONE DIPROPIONATE 0.05 % EX CREA: TOPICAL_CREAM | Freq: Two times a day (BID) | CUTANEOUS | 3 refills | Status: AC | PRN

## 2020-09-02 MED ORDER — MINOCYCLINE HCL 100 MG PO CAPS: 100.0000 mg | ORAL_CAPSULE | Freq: Two times a day (BID) | ORAL | 3 refills | Status: AC

## 2020-09-02 MED ORDER — METRONIDAZOLE 1 % EX GEL: 1.0000 "application " | Freq: Every day | CUTANEOUS | 6 refills | Status: AC

## 2020-09-02 NOTE — Progress Notes (Signed)
   Follow-Up Visit   Subjective  Nathan Wiley is a 80 y.o. male who presents for the following: Skin Problem (Red & inflamed.PCP started on carbamazepine '100mg'$  bid- D/c  after 1 month . He think that this may have been a contributor to his nose. He has always managed his rosacea with topical metronidazole and tretinoin. It has really flared up in the past 5 months. Tender).   The following portions of the chart were reviewed this encounter and updated as appropriate:  Tobacco  Allergies  Meds  Problems  Med Hx  Surg Hx  Fam Hx      Objective  Well appearing patient in no apparent distress; mood and affect are within normal limits.  A focused examination was performed including face. Relevant physical exam findings are noted in the Assessment and Plan.  Dorsum of Nose Erythematous, swollen tissue with a thin scab on the distal bridge      Assessment & Plan  Rosacea Dorsum of Nose  minocycline (MINOCIN) 100 MG capsule - Dorsum of Nose Take 1 capsule (100 mg total) by mouth 2 (two) times daily.  alclomethasone (ACLOVATE) 0.05 % cream - Dorsum of Nose Apply topically 2 (two) times daily as needed (Rash).  metroNIDAZOLE (METROGEL) 1 % gel - Dorsum of Nose Apply 1 application topically daily.    I, Tonjia Parillo, PA-C, have reviewed all documentation's for this visit.  The documentation on 09/02/20 for the exam, diagnosis, procedures and orders are all accurate and complete.

## 2020-11-20 ENCOUNTER — Other Ambulatory Visit: Payer: Self-pay

## 2020-11-20 ENCOUNTER — Ambulatory Visit (INDEPENDENT_AMBULATORY_CARE_PROVIDER_SITE_OTHER): Payer: Medicare HMO | Admitting: Physician Assistant

## 2020-11-20 DIAGNOSIS — L719 Rosacea, unspecified: Secondary | ICD-10-CM | POA: Diagnosis not present

## 2020-11-20 MED ORDER — CEPHALEXIN 500 MG PO CAPS: 500.0000 mg | ORAL_CAPSULE | Freq: Two times a day (BID) | ORAL | 0 refills | Status: DC

## 2020-11-25 ENCOUNTER — Encounter: Payer: Self-pay | Admitting: Physician Assistant

## 2020-11-25 NOTE — Progress Notes (Signed)
   Follow-Up Visit   Subjective  Nathan Wiley is a 80 y.o. male who presents for the following: Follow-up (For rosacea on nose. Treatment [facility]. Patient had to stop medication because he had indigestion so bad.  He has a medical history of irritable bowel syndrome. We also gave him alclometasone and metronidazole. He is using the topicals. Nose is still red. ).   The following portions of the chart were reviewed this encounter and updated as appropriate:  Tobacco  Allergies  Meds  Problems  Med Hx  Surg Hx  Fam Hx      Objective  Well appearing patient in no apparent distress; mood and affect are within normal limits.  A focused examination was performed including face. Relevant physical exam findings are noted in the Assessment and Plan.  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       Assessment & Plan  Rosacea Nose  cephALEXin (KEFLEX) 500 MG capsule - Nose Take 1 capsule (500 mg total) by mouth 2 (two) times daily.  Related Medications alclomethasone (ACLOVATE) 0.05 % cream Apply topically 2 (two) times daily as needed (Rash).  metroNIDAZOLE (METROGEL) 1 % gel Apply 1 application topically daily.    I, Mavis Fichera, PA-C, have reviewed all documentation's for this visit.  The documentation on 11/25/20 for the exam, diagnosis, procedures and orders are all accurate and complete.

## 2020-12-03 ENCOUNTER — Telehealth: Payer: Self-pay | Admitting: Physician Assistant

## 2020-12-03 DIAGNOSIS — L719 Rosacea, unspecified: Secondary | ICD-10-CM

## 2020-12-03 MED ORDER — CEPHALEXIN 500 MG PO CAPS: 500.0000 mg | ORAL_CAPSULE | Freq: Two times a day (BID) | ORAL | 0 refills | Status: DC

## 2020-12-03 NOTE — Telephone Encounter (Signed)
Patient aware to start taking one a day and if he is clear stay on the dose its treatment no cure.

## 2020-12-03 NOTE — Telephone Encounter (Signed)
Patient is calling to say that he is much improved after using the antibiotic and wants to know if he needs to continue the antibiotic after he finishes this round.  Also patient wants to know if he should schedule an office visit for a follow up.

## 2020-12-10 ENCOUNTER — Ambulatory Visit: Payer: Medicare HMO | Admitting: Physician Assistant

## 2021-02-06 ENCOUNTER — Other Ambulatory Visit: Payer: Self-pay | Admitting: Physician Assistant

## 2021-02-06 DIAGNOSIS — L719 Rosacea, unspecified: Secondary | ICD-10-CM

## 2021-03-24 ENCOUNTER — Other Ambulatory Visit: Payer: Self-pay | Admitting: Physician Assistant

## 2021-03-24 DIAGNOSIS — L719 Rosacea, unspecified: Secondary | ICD-10-CM

## 2021-04-24 ENCOUNTER — Other Ambulatory Visit: Payer: Self-pay | Admitting: Physician Assistant

## 2021-04-24 DIAGNOSIS — L719 Rosacea, unspecified: Secondary | ICD-10-CM

## 2021-04-27 NOTE — Telephone Encounter (Signed)
Refill approved.

## 2021-05-21 ENCOUNTER — Ambulatory Visit: Payer: Medicare HMO | Admitting: Physician Assistant

## 2021-05-24 ENCOUNTER — Other Ambulatory Visit: Payer: Self-pay | Admitting: Physician Assistant

## 2021-05-24 DIAGNOSIS — L719 Rosacea, unspecified: Secondary | ICD-10-CM

## 2021-06-08 ENCOUNTER — Other Ambulatory Visit: Payer: Self-pay | Admitting: Physician Assistant

## 2021-06-08 DIAGNOSIS — L719 Rosacea, unspecified: Secondary | ICD-10-CM

## 2021-06-09 ENCOUNTER — Telehealth: Payer: Self-pay | Admitting: Physician Assistant

## 2021-06-09 DIAGNOSIS — L719 Rosacea, unspecified: Secondary | ICD-10-CM

## 2021-06-09 MED ORDER — CEPHALEXIN 500 MG PO CAPS: ORAL_CAPSULE | ORAL | 6 refills | Status: AC

## 2021-06-09 NOTE — Telephone Encounter (Signed)
Refill okay per kelli sheffield= patient takes keflex for rosacea. Sent to patient pharmacy.  ?

## 2021-06-09 NOTE — Telephone Encounter (Signed)
Patient is calling to say that the refill for  ?cephALEXin ?cephALEXin (KEFLEX) 500 MG capsule ?Was denied.  Patient states that [facility], PA-C told him that he was to be on it for a year 2 pills bid and then probably 1 pill qd for the rest of his life.  Patient wants to know why the prescription was denied.  Patient uses  ?[facility] [address] (Ph: [phone] ?

## 2021-08-13 ENCOUNTER — Ambulatory Visit: Payer: Medicare HMO | Admitting: Physician Assistant

## 2021-10-06 ENCOUNTER — Ambulatory Visit: Payer: Medicare HMO | Admitting: Physician Assistant

## 2022-02-19 IMAGING — CT CT ANGIO CHEST
2 of 6 series · 18 of 46 positions shown · IV contrast (APPLIED)
Comparison: None.

CLINICAL DATA: PE suspected. Recent hip replacement. Likely left
lower extremity DVT.

EXAM:
CT ANGIOGRAPHY CHEST WITH CONTRAST
TECHNIQUE: Multidetector CT imaging of the chest was performed using the
standard protocol during bolus administration of intravenous
contrast. Multiplanar CT image reconstructions and MIPs were
obtained to evaluate the vascular anatomy.
CONTRAST:  100mL OMNIPAQUE IOHEXOL 350 MG/ML SOLN

[Series 7: thins · axial · 0.70mm/px · z∈[+1112,+1412]mm · 15 of 471 slices shown]
[im 21/471  lung]
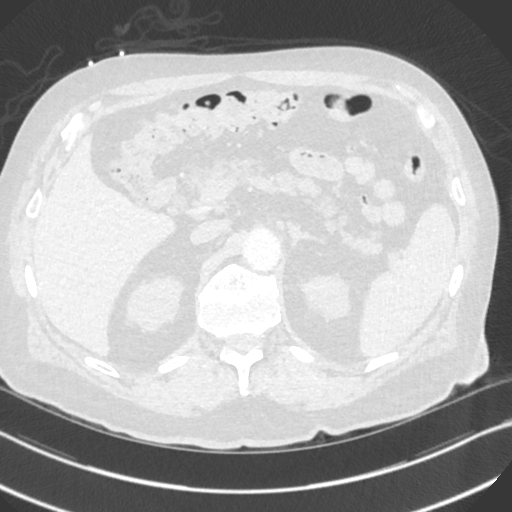
[im 62/471  soft-tissue]
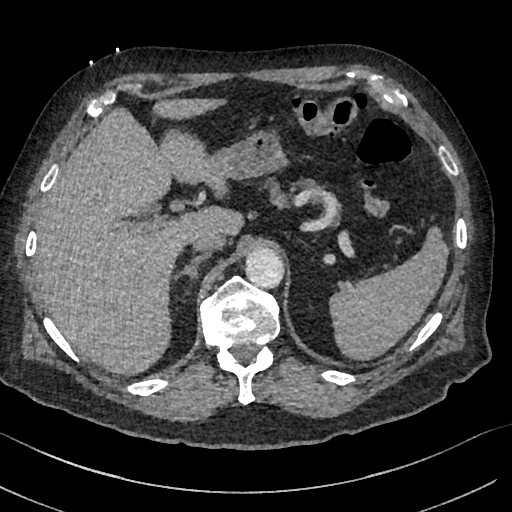
[im 82/471  lung]
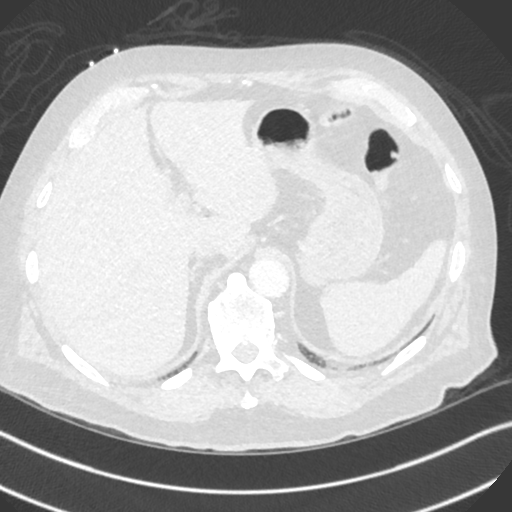
[im 123/471  soft-tissue]
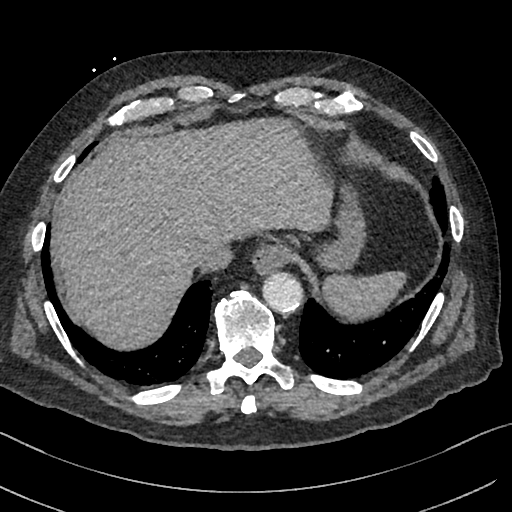
[im 144/471  lung]
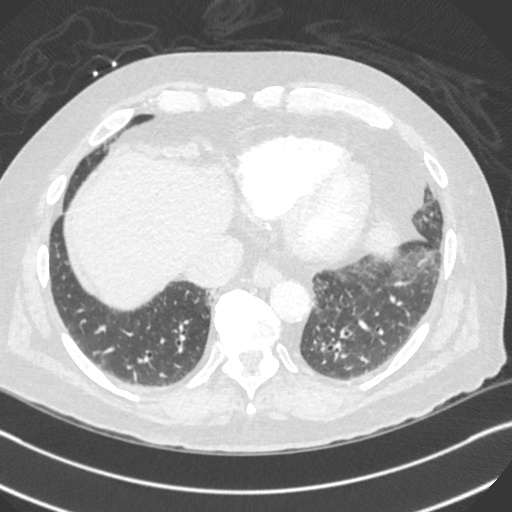
[im 184/471  soft-tissue]
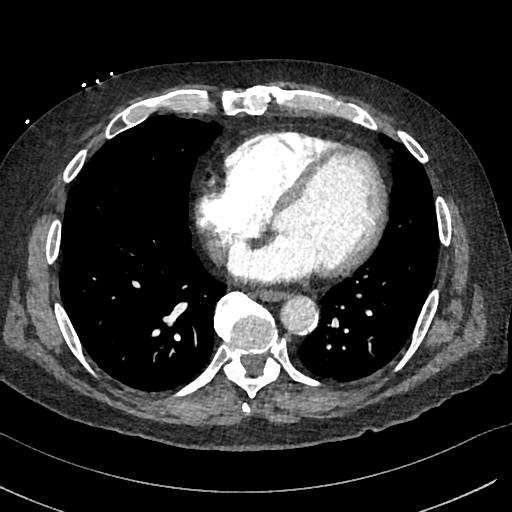
[im 205/471  lung]
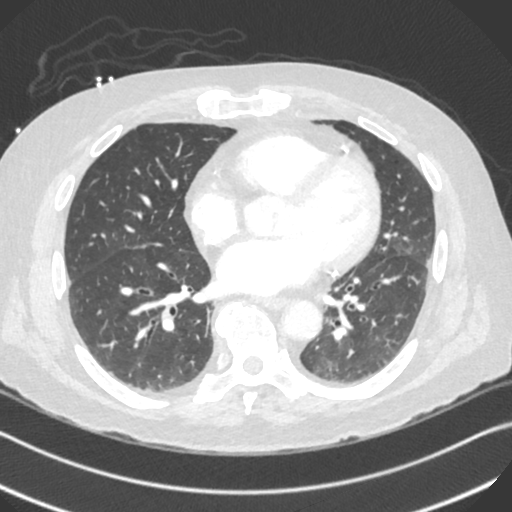
[im 246/471  soft-tissue]
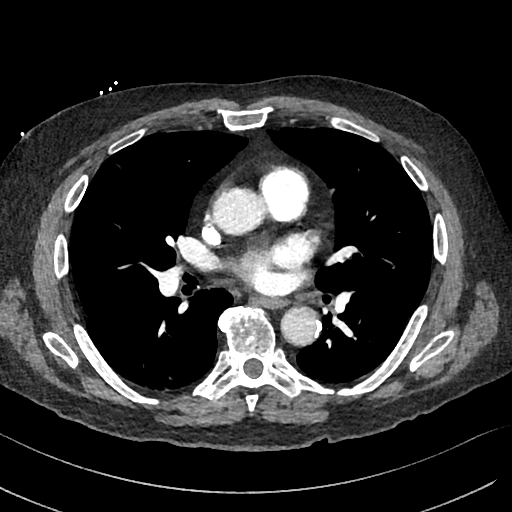
[im 266/471  lung]
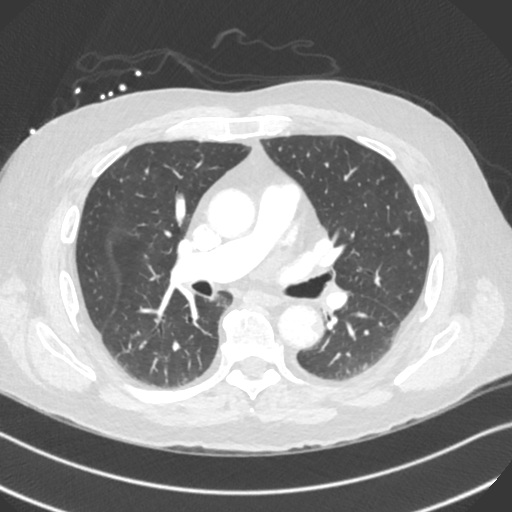
[im 287/471  soft-tissue]
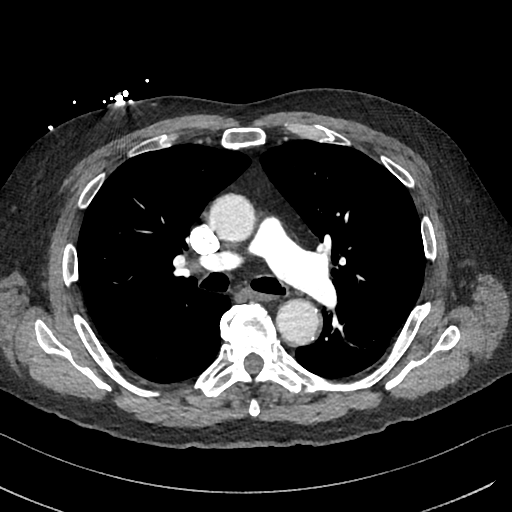
[im 327/471  lung]
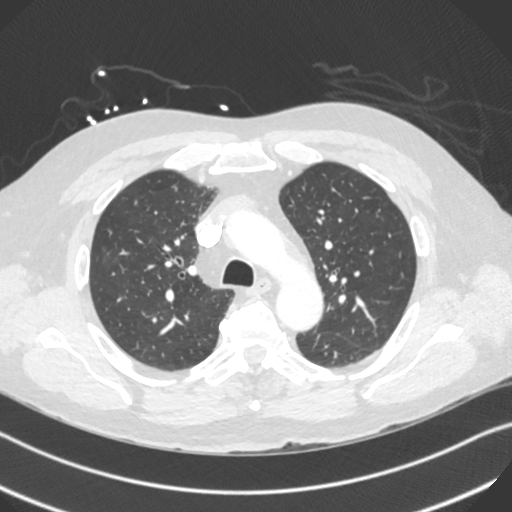
[im 348/471  soft-tissue]
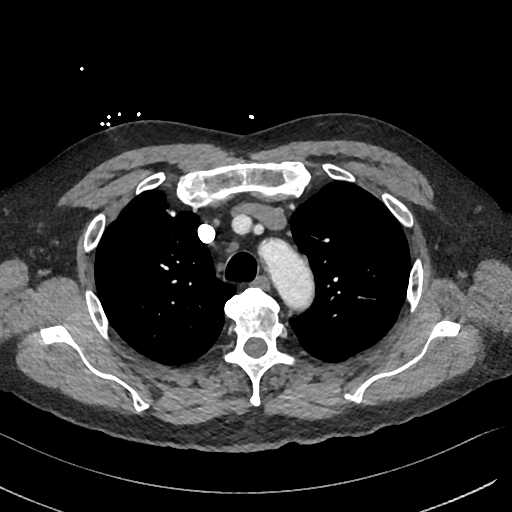
[im 389/471  lung]
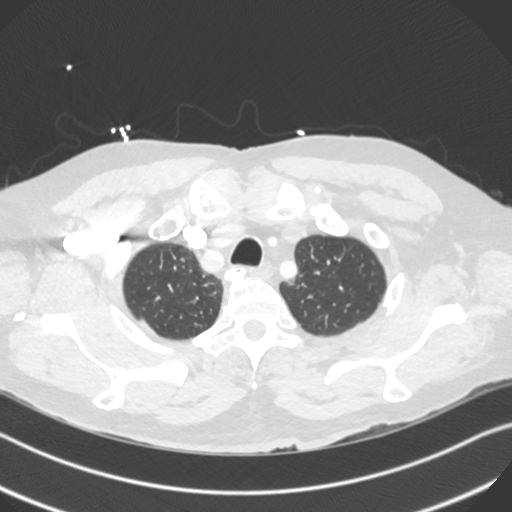
[im 409/471  soft-tissue]
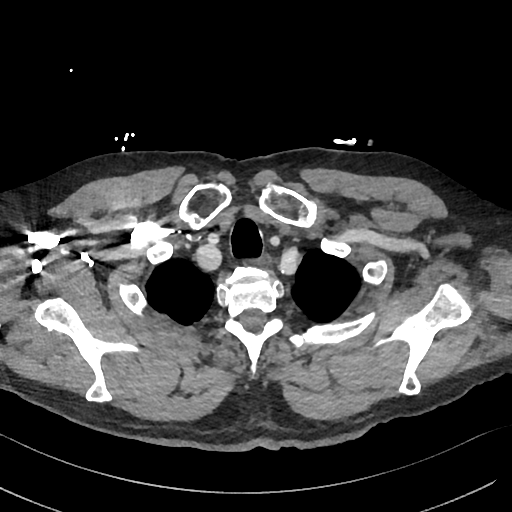
[im 450/471  lung]
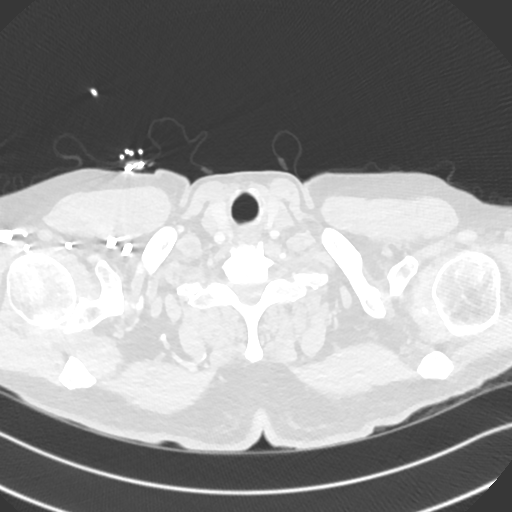

[Series 8: cor · coronal · 0.63mm/px · 3 of 138 slices shown]
[im 35/138  soft-tissue]
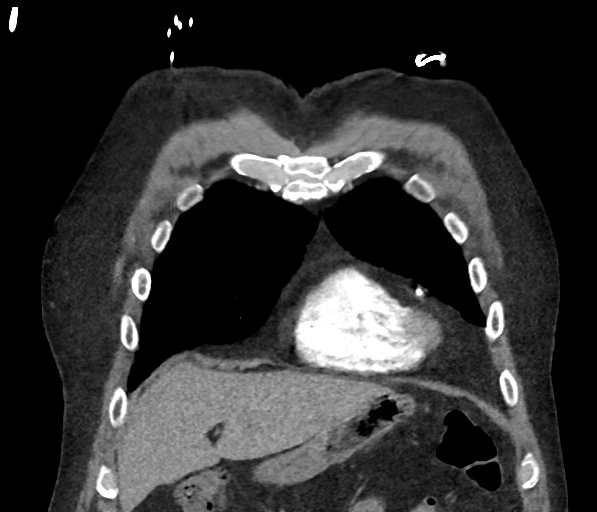
[im 69/138  soft-tissue]
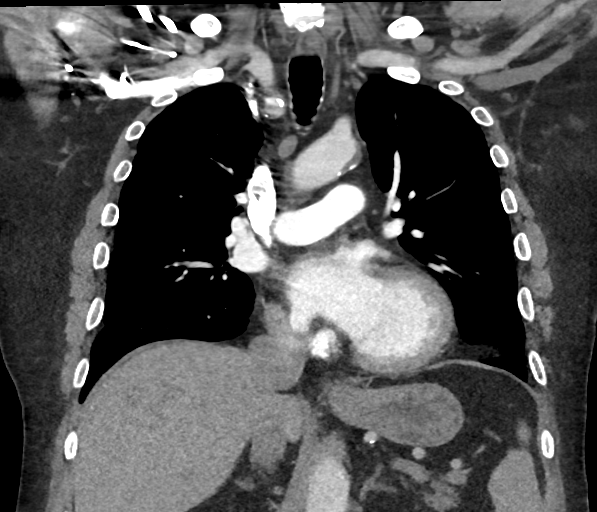
[im 103/138  soft-tissue]
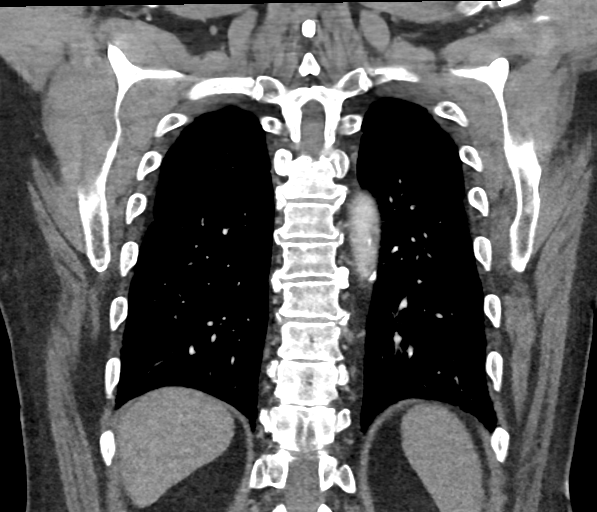

[18 of 46 positions shown; findings below may reference images not displayed]

FINDINGS: Cardiovascular: Contrast injection is sufficient to demonstrate
satisfactory opacification of the pulmonary arteries to the
segmental level. There is no pulmonary embolus or evidence of right
heart strain. The size of the main pulmonary artery is normal.
Cardiomegaly with coronary artery calcification. The course and
caliber of the aorta are normal. There is mild atherosclerotic
calcification. Opacification decreased due to pulmonary arterial
phase contrast bolus timing.

Mediastinum/Nodes:

-- No mediastinal lymphadenopathy.

-- No hilar lymphadenopathy.

-- No axillary lymphadenopathy.

-- No supraclavicular lymphadenopathy.

-- Normal thyroid gland where visualized.

-  Unremarkable esophagus.

Lungs/Pleura: There is a ground-glass airspace opacity in the right
upper lobe (axial series 6, image 49), measuring approximately 8 mm.
There is no pneumothorax. No large pleural effusion.

Upper Abdomen: Contrast bolus timing is not optimized for evaluation
of the abdominal organs. The patient is status post prior
cholecystectomy. There is no acute abnormality in the upper abdomen.
The partially visualized spleen appears to be enlarged.

Musculoskeletal: No chest wall abnormality. No bony spinal canal
stenosis.

Review of the MIP images confirms the above findings.
IMPRESSION: 1. No evidence of pulmonary embolus.
2. Ground-glass airspace opacity in the right upper lobe. Initial
follow-up with CT at 6-12 months is recommended to confirm
persistence. If persistent, repeat CT is recommended every 2 years
until 5 years of stability has been established. This recommendation
follows the consensus statement: Guidelines for Management of
Incidental Pulmonary Nodules Detected on CT Images: From the
3. Cardiomegaly with coronary artery disease.
4. Probable splenomegaly.

Aortic Atherosclerosis ([id number].I).
